# Patient Record
Sex: Male | Born: 1985 | Race: Black or African American | Hispanic: No | Marital: Single | Smoking: Current every day smoker
Health system: Southern US, Community
[De-identification: ages and names within clinical notes are randomized; demographics above are authoritative.]

---

## 2011-02-04 ENCOUNTER — Emergency Department (HOSPITAL_COMMUNITY): Payer: Self-pay

## 2011-02-04 ENCOUNTER — Encounter: Payer: Self-pay | Admitting: *Deleted

## 2011-02-04 ENCOUNTER — Emergency Department (HOSPITAL_COMMUNITY)
Admission: EM | Admit: 2011-02-04 | Discharge: 2011-02-04 | Disposition: A | Discharge status: Home / Self Care | Payer: Self-pay | Attending: Emergency Medicine | Admitting: Emergency Medicine

## 2011-02-04 DIAGNOSIS — W19XXXA Unspecified fall, initial encounter: Secondary | ICD-10-CM | POA: Insufficient documentation

## 2011-02-04 DIAGNOSIS — S60229A Contusion of unspecified hand, initial encounter: Secondary | ICD-10-CM | POA: Insufficient documentation

## 2011-02-04 DIAGNOSIS — M79609 Pain in unspecified limb: Secondary | ICD-10-CM | POA: Insufficient documentation

## 2011-02-04 DIAGNOSIS — J45909 Unspecified asthma, uncomplicated: Secondary | ICD-10-CM | POA: Insufficient documentation

## 2011-02-04 MED ORDER — HYDROCODONE-ACETAMINOPHEN 5-325 MG PO TABS: 2.0000 | ORAL_TABLET | ORAL | Status: AC | PRN

## 2011-02-04 NOTE — ED Notes (Addendum)
Patient transported to X-ray 
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
## 2011-02-04 NOTE — ED Notes (Signed)
Patient transported back from xray 

## 2011-11-10 ENCOUNTER — Emergency Department (HOSPITAL_COMMUNITY)
Admission: EM | Admit: 2011-11-10 | Discharge: 2011-11-11 | Disposition: A | Discharge status: Home / Self Care | Payer: Self-pay | Attending: Emergency Medicine | Admitting: Emergency Medicine

## 2011-11-10 ENCOUNTER — Encounter (HOSPITAL_COMMUNITY): Payer: Self-pay | Admitting: *Deleted

## 2011-11-10 DIAGNOSIS — R109 Unspecified abdominal pain: Secondary | ICD-10-CM | POA: Insufficient documentation

## 2011-11-10 DIAGNOSIS — Z202 Contact with and (suspected) exposure to infections with a predominantly sexual mode of transmission: Secondary | ICD-10-CM | POA: Insufficient documentation

## 2011-11-10 MED ORDER — OXYCODONE-ACETAMINOPHEN 5-325 MG PO TABS
2.0000 | ORAL_TABLET | Freq: Once | ORAL | Status: AC
  Administered 2011-11-10: 2 via ORAL
  Filled 2011-11-10: qty 2
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
## 2013-01-10 IMAGING — CR DG HAND COMPLETE 3+V*L*
3 series · 3 of 3 positions shown · non-contrast
Comparison: None

CLINICAL DATA: Pain and swelling post fall

LEFT HAND - COMPLETE 3+ VIEW

[x hand pa left]
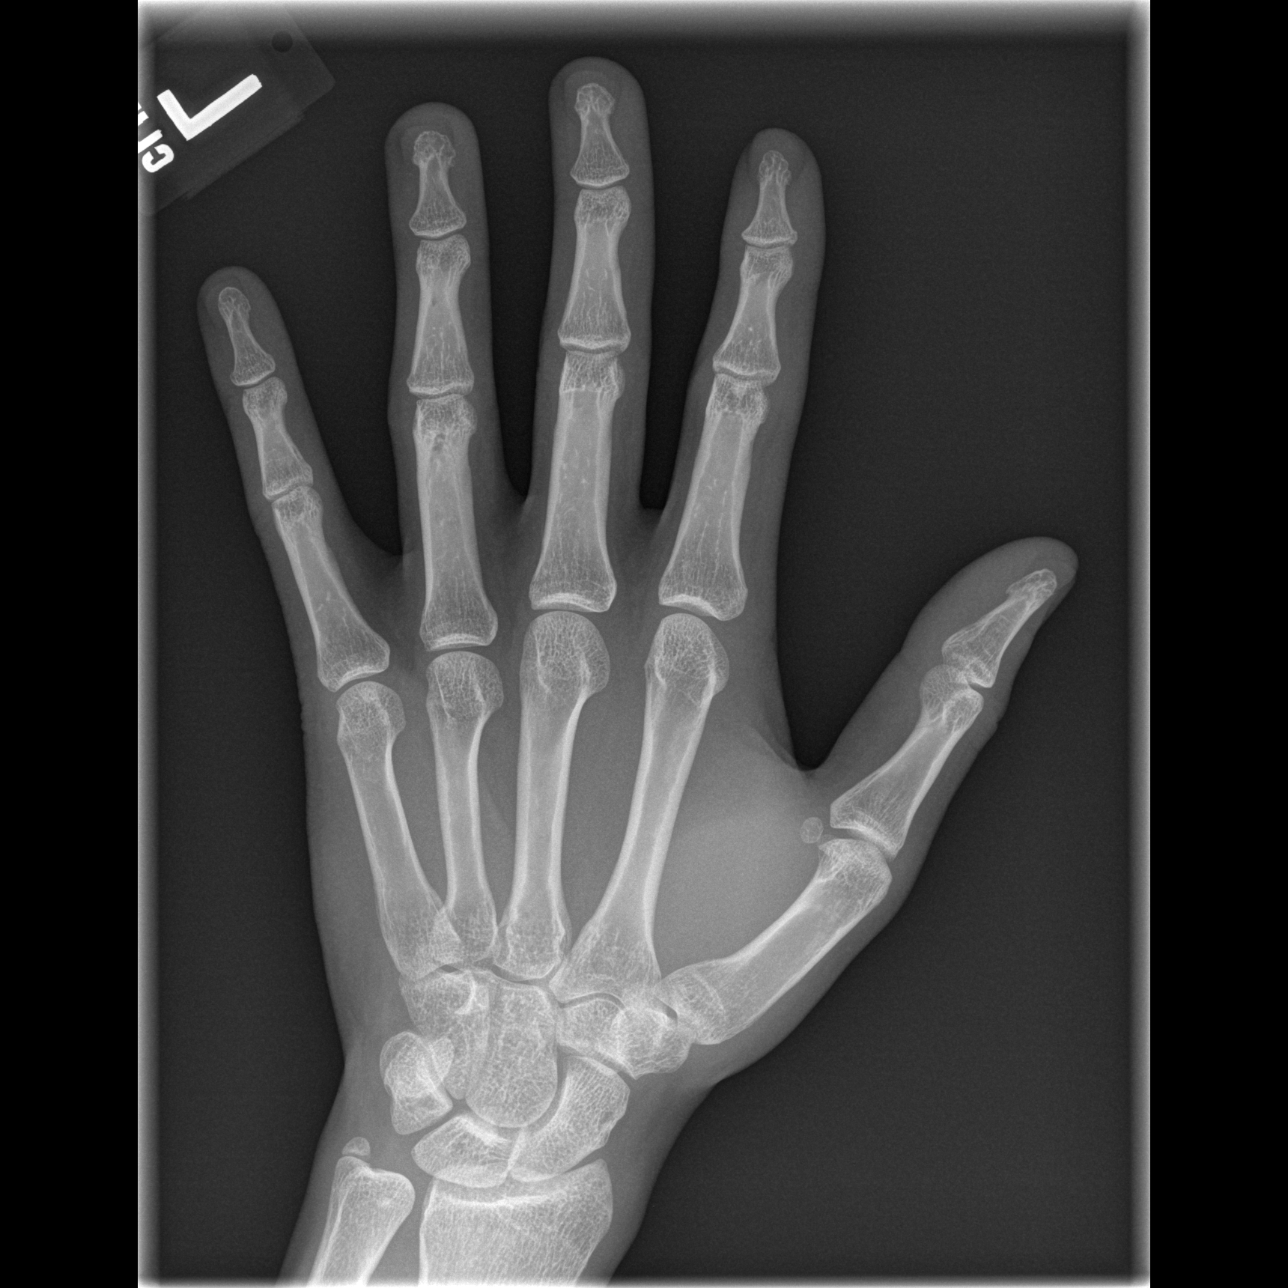

[x hand oblique left]
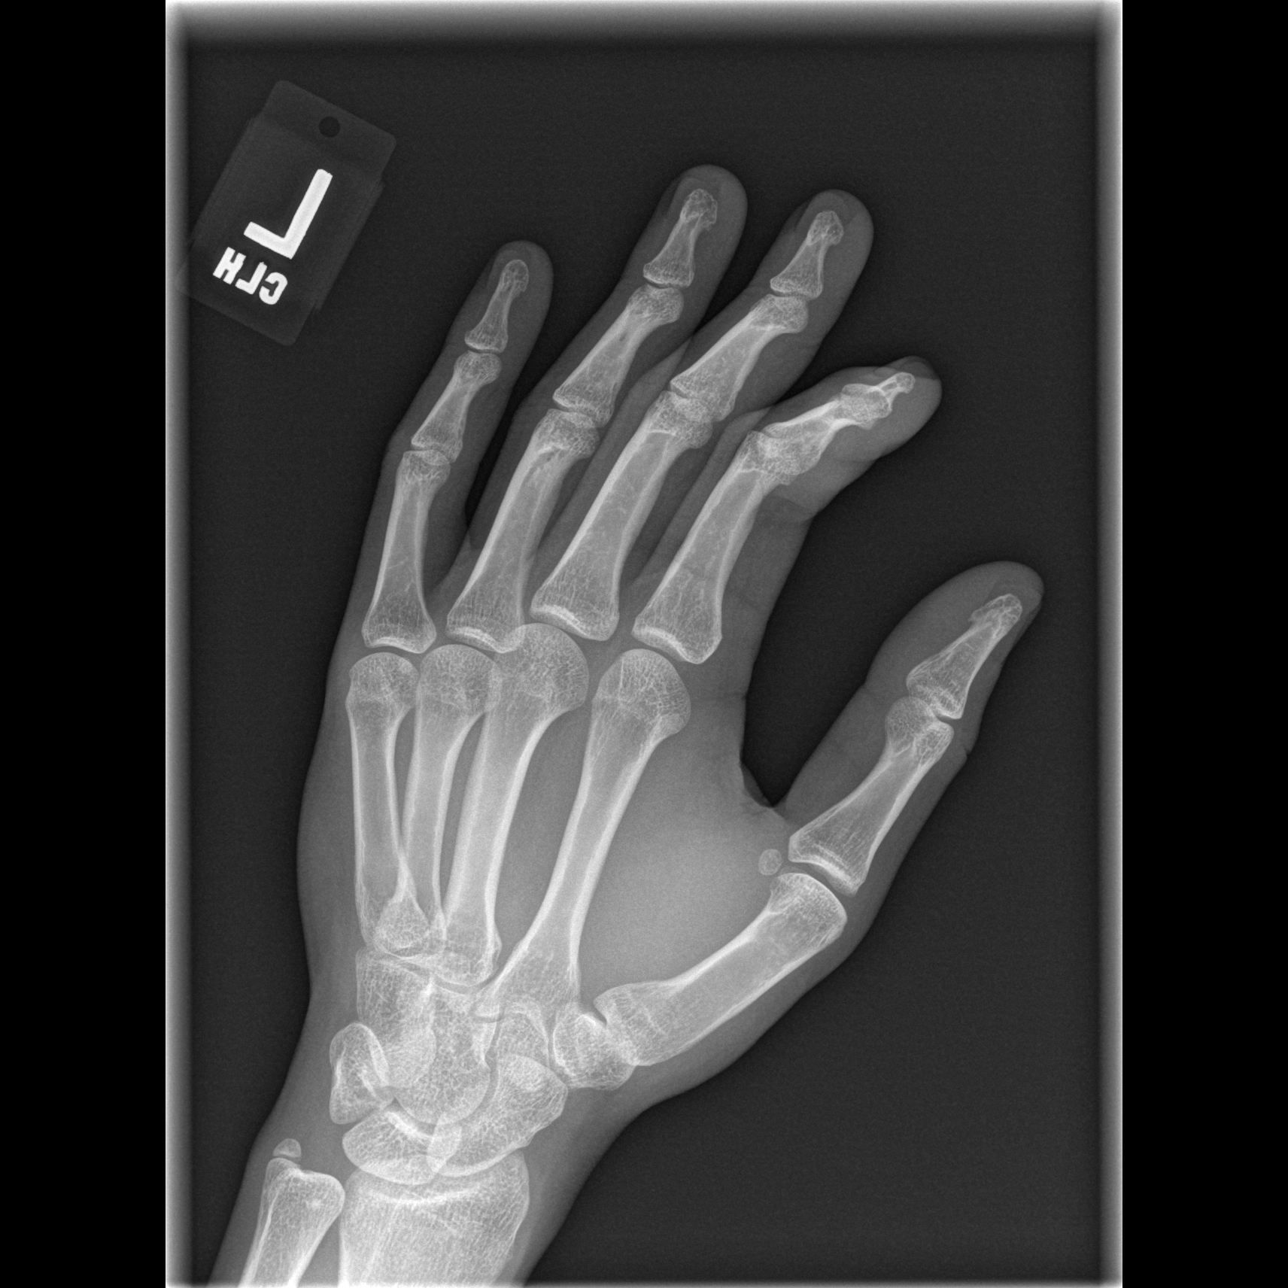

[x hand lat left]
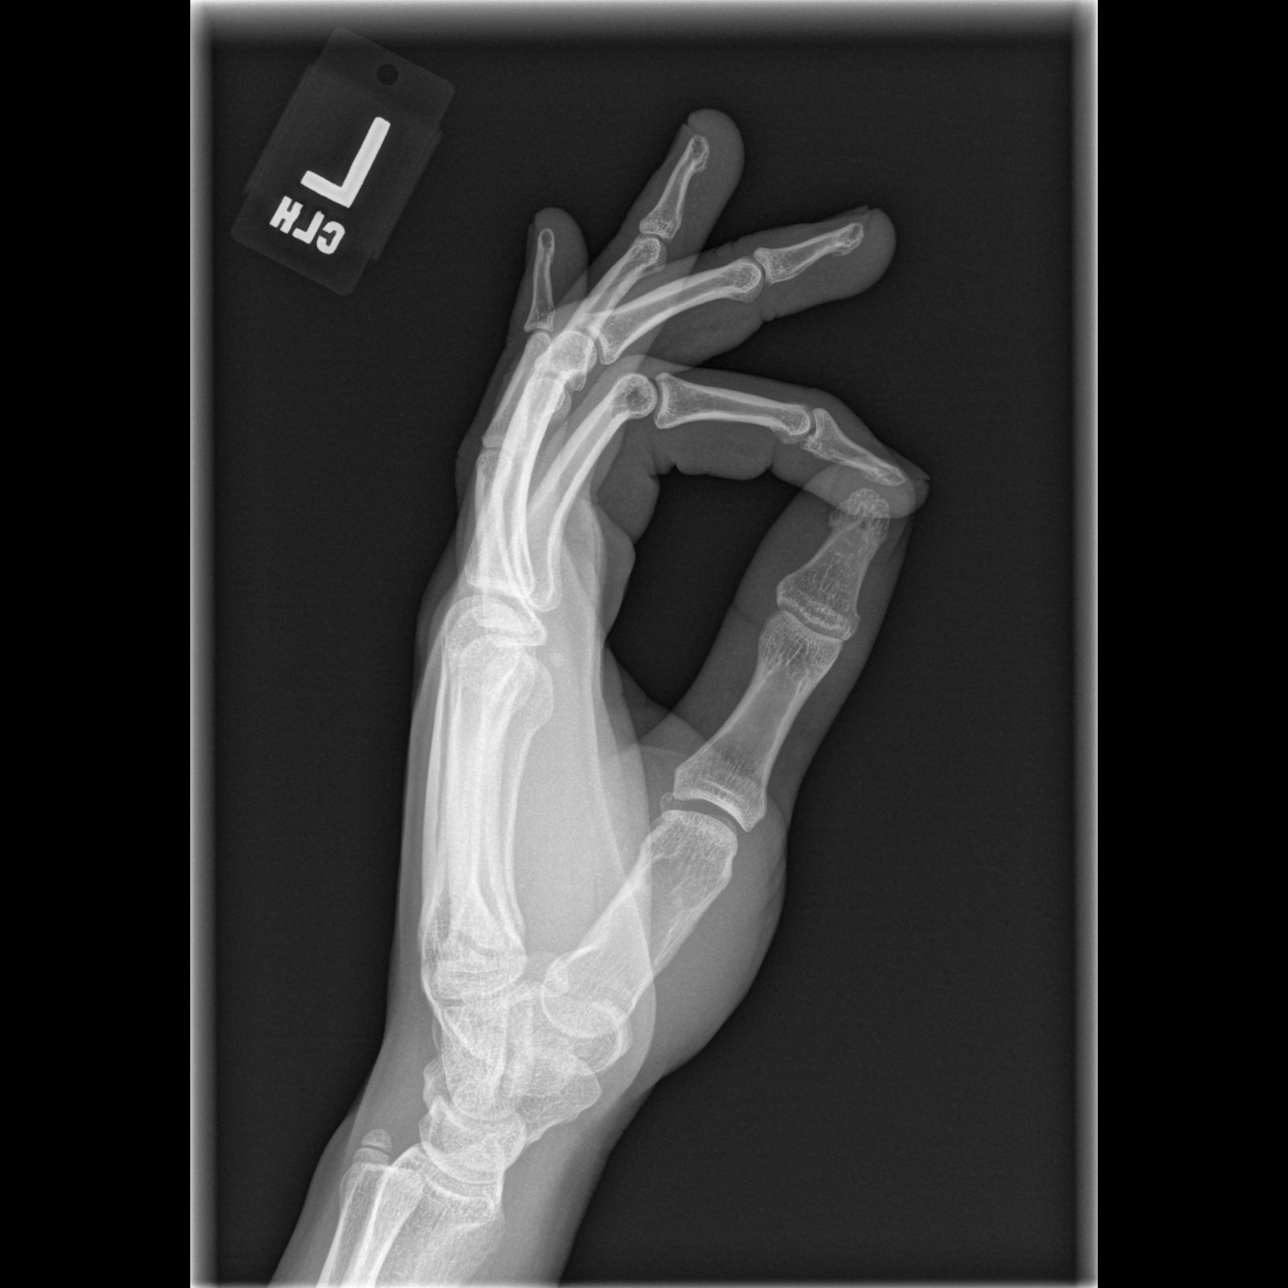

[3 of 3 positions shown; findings below may reference images not displayed]

FINDINGS: Nonfused ulnar styloid ossification center, normal variant.
Osseous mineralization normal.
Joint spaces preserved.
No acute fracture, dislocation, or bone destruction.
IMPRESSION: No acute bony abnormalities.

## 2013-11-05 ENCOUNTER — Encounter (HOSPITAL_COMMUNITY): Payer: Self-pay | Admitting: Emergency Medicine

## 2013-11-05 ENCOUNTER — Emergency Department (HOSPITAL_COMMUNITY)
Admission: EM | Admit: 2013-11-05 | Discharge: 2013-11-05 | Disposition: A | Discharge status: Home / Self Care | Payer: Self-pay | Attending: Emergency Medicine | Admitting: Emergency Medicine

## 2013-11-05 DIAGNOSIS — S40022A Contusion of left upper arm, initial encounter: Secondary | ICD-10-CM

## 2013-11-05 DIAGNOSIS — Y9389 Activity, other specified: Secondary | ICD-10-CM | POA: Insufficient documentation

## 2013-11-05 DIAGNOSIS — Z7982 Long term (current) use of aspirin: Secondary | ICD-10-CM | POA: Insufficient documentation

## 2013-11-05 DIAGNOSIS — IMO0002 Reserved for concepts with insufficient information to code with codable children: Secondary | ICD-10-CM | POA: Insufficient documentation

## 2013-11-05 DIAGNOSIS — Y849 Medical procedure, unspecified as the cause of abnormal reaction of the patient, or of later complication, without mention of misadventure at the time of the procedure: Secondary | ICD-10-CM | POA: Insufficient documentation

## 2013-11-05 DIAGNOSIS — Y9289 Other specified places as the place of occurrence of the external cause: Secondary | ICD-10-CM | POA: Insufficient documentation

## 2013-11-05 DIAGNOSIS — X58XXXA Exposure to other specified factors, initial encounter: Secondary | ICD-10-CM | POA: Insufficient documentation

## 2013-11-05 DIAGNOSIS — F17200 Nicotine dependence, unspecified, uncomplicated: Secondary | ICD-10-CM | POA: Insufficient documentation

## 2013-11-05 DIAGNOSIS — J45909 Unspecified asthma, uncomplicated: Secondary | ICD-10-CM | POA: Insufficient documentation
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
## 2013-11-05 NOTE — Discharge Instructions (Signed)

## 2013-11-06 NOTE — ED Provider Notes (Signed)
Medical screening examination/treatment/procedure(s) were performed by non-physician practitioner and as supervising physician I was immediately available for consultation/collaboration.  Leslee Home, M.D.  Reuben Likes, MD 11/06/13 [phone]
